# Patient Record
Sex: Male | Born: 1975 | Race: Black or African American | Hispanic: No | Marital: Single | Smoking: Never smoker
Health system: Southern US, Community
[De-identification: ages and names within clinical notes are randomized; demographics above are authoritative.]

## PROBLEM LIST (undated history)

## (undated) DIAGNOSIS — J189 Pneumonia, unspecified organism: Secondary | ICD-10-CM

## (undated) DIAGNOSIS — I10 Essential (primary) hypertension: Secondary | ICD-10-CM

## (undated) DIAGNOSIS — E7800 Pure hypercholesterolemia, unspecified: Secondary | ICD-10-CM

## (undated) HISTORY — PX: ABDOMINAL SURGERY: SHX537

---

## 2013-10-26 DIAGNOSIS — G5620 Lesion of ulnar nerve, unspecified upper limb: Secondary | ICD-10-CM | POA: Insufficient documentation

## 2019-07-04 ENCOUNTER — Other Ambulatory Visit: Payer: Self-pay

## 2019-07-04 ENCOUNTER — Emergency Department (HOSPITAL_COMMUNITY): Payer: Medicaid Other

## 2019-07-04 ENCOUNTER — Encounter (HOSPITAL_COMMUNITY): Payer: Self-pay

## 2019-07-04 ENCOUNTER — Emergency Department (HOSPITAL_COMMUNITY)
Admission: EM | Admit: 2019-07-04 | Discharge: 2019-07-04 | Disposition: A | Discharge status: Home / Self Care | Payer: Medicaid Other | Attending: Emergency Medicine | Admitting: Emergency Medicine

## 2019-07-04 DIAGNOSIS — J189 Pneumonia, unspecified organism: Secondary | ICD-10-CM | POA: Insufficient documentation

## 2019-07-04 DIAGNOSIS — R0602 Shortness of breath: Secondary | ICD-10-CM | POA: Diagnosis present

## 2019-07-04 DIAGNOSIS — R05 Cough: Secondary | ICD-10-CM | POA: Diagnosis not present

## 2019-07-04 DIAGNOSIS — R0789 Other chest pain: Secondary | ICD-10-CM | POA: Diagnosis not present

## 2019-07-04 DIAGNOSIS — R531 Weakness: Secondary | ICD-10-CM | POA: Diagnosis not present

## 2019-07-04 LAB — BASIC METABOLIC PANEL
Anion gap: 13 (ref 5–15)
BUN: 10 mg/dL (ref 6–20)
CO2: 23 mmol/L (ref 22–32)
Calcium: 9 mg/dL (ref 8.9–10.3)
Chloride: 102 mmol/L (ref 98–111)
Creatinine, Ser: 0.83 mg/dL (ref 0.61–1.24)
GFR calc Af Amer: >60 mL/min (ref >60)
GFR calc non Af Amer: >60 mL/min (ref >60)
Glucose, Bld: 117 mg/dL — ABNORMAL HIGH (ref 70–99)
Potassium: 3.3 mmol/L — ABNORMAL LOW (ref 3.5–5.1)
Sodium: 138 mmol/L (ref 135–145)

## 2019-07-04 LAB — CBC
HCT: 38.3 % — ABNORMAL LOW (ref 39.0–52.0)
Hemoglobin: 13.1 g/dL (ref 13.0–17.0)
MCH: 30.8 pg (ref 26.0–34.0)
MCHC: 34.2 g/dL (ref 30.0–36.0)
MCV: 90.1 fL (ref 80.0–100.0)
Platelets: 219 10*3/uL (ref 150–400)
RBC: 4.25 MIL/uL (ref 4.22–5.81)
RDW: 13 % (ref 11.5–15.5)
WBC: 3.4 10*3/uL — ABNORMAL LOW (ref 4.0–10.5)
nRBC: 0 % (ref 0.0–0.2)

## 2019-07-04 MED ORDER — KETOROLAC TROMETHAMINE 15 MG/ML IJ SOLN
15.0000 mg | Freq: Once | INTRAMUSCULAR | Status: AC
  Administered 2019-07-04: 15 mg via INTRAVENOUS
  Filled 2019-07-04: qty 1

## 2019-07-04 MED ORDER — AZITHROMYCIN 250 MG PO TABS: 250.0000 mg | ORAL_TABLET | Freq: Every day | ORAL | 0 refills | Status: AC

## 2019-07-04 MED ORDER — ACETAMINOPHEN 325 MG PO TABS
650.0000 mg | ORAL_TABLET | Freq: Once | ORAL | Status: AC
  Administered 2019-07-04: 650 mg via ORAL
  Filled 2019-07-04: qty 2

## 2019-07-04 MED ORDER — SODIUM CHLORIDE 0.9 % IV BOLUS
1000.0000 mL | Freq: Once | INTRAVENOUS | Status: AC
  Administered 2019-07-04: 19:00:00 1000 mL via INTRAVENOUS

## 2019-07-04 MED ORDER — SODIUM CHLORIDE 0.9 % IV SOLN
1.0000 g | Freq: Once | INTRAVENOUS | Status: AC
  Administered 2019-07-04: 19:00:00 1 g via INTRAVENOUS
  Filled 2019-07-04: qty 10

## 2019-07-04 MED ORDER — CEPHALEXIN 500 MG PO CAPS: 500.0000 mg | ORAL_CAPSULE | Freq: Two times a day (BID) | ORAL | 0 refills | Status: AC

## 2019-07-04 MED ORDER — SODIUM CHLORIDE 0.9 % IV SOLN
500.0000 mg | Freq: Once | INTRAVENOUS | Status: AC
  Administered 2019-07-04: 500 mg via INTRAVENOUS
  Filled 2019-07-04: qty 500

## 2019-07-04 NOTE — ED Notes (Signed)
Discharge instructions reviewed with pt. Pt verbalized understanding.   

## 2019-07-04 NOTE — ED Provider Notes (Signed)
[facility] EMERGENCY DEPARTMENT Provider Note   CSN: [id number] Arrival date & time: 07/04/19  1446     History Chief Complaint  Patient presents with  . Shortness of Breath  . Cough    Luke Goodwin is a 44 y.o. male.  HPI    Patient presents with concern of chest pain, fatigue, discomfort, nausea, anorexia, vomiting. Onset was about 1 week ago, and since that time he has had persistent symptoms.  He has been intolerant of oral medication, thus, no clear relieving factors. Patient went to a different emergency department yesterday, reportedly had x-ray, was notified of pneumonia, but left prior to receiving therapy, or labs. Today he presents due to persistent discomfort. He states that he is generally well, denies chronic medical problems, does not smoke, does not drink.  History reviewed. No pertinent past medical history.  There are no problems to display for this patient.   History reviewed. No pertinent surgical history.     No family history on file.  Social History   Tobacco Use  . Smoking status: Not on file  Substance Use Topics  . Alcohol use: Not on file  . Drug use: Not on file    Home Medications Prior to Admission medications   Not on File    Allergies    Patient has no allergy information on record.  Review of Systems   Review of Systems  Constitutional:       Per HPI, otherwise negative  HENT:       Per HPI, otherwise negative  Respiratory:       Per HPI, otherwise negative  Cardiovascular:       Per HPI, otherwise negative  Gastrointestinal: Positive for nausea and vomiting. Negative for abdominal pain.  Endocrine:       Negative aside from HPI  Genitourinary:       Neg aside from HPI   Musculoskeletal:       Per HPI, otherwise negative  Skin: Negative.   Allergic/Immunologic: Negative for immunocompromised state.  Neurological: Positive for weakness. Negative for syncope.    Physical Exam Updated Vital  Signs BP 133/88   Pulse 65   Temp 98.6 F (37 C) (Oral)   Resp 20   Ht 5\' 11"  (1.803 m)   Wt 80.7 kg   SpO2 95%   BMI 24.83 kg/m   Physical Exam Vitals and nursing note reviewed.  Constitutional:      General: He is not in acute distress.    Appearance: He is well-developed. He is ill-appearing. He is not toxic-appearing or diaphoretic.  HENT:     Head: Normocephalic and atraumatic.  Eyes:     Conjunctiva/sclera: Conjunctivae normal.  Cardiovascular:     Rate and Rhythm: Normal rate and regular rhythm.  Pulmonary:     Effort: Tachypnea present.  Abdominal:     General: There is no distension.  Skin:    General: Skin is warm and dry.  Neurological:     Mental Status: He is alert and oriented to person, place, and time.     ED Results / Procedures / Treatments   Labs (all labs ordered are listed, but only abnormal results are displayed) Labs Reviewed  BASIC METABOLIC PANEL - Abnormal; Notable for the following components:      Result Value   Potassium 3.3 (*)    Glucose, Bld 117 (*)    All other components within normal limits  CBC - Abnormal; Notable for the  following components:   WBC 3.4 (*)    HCT 38.3 (*)    All other components within normal limits    EKG EKG Interpretation  Date/Time:  Wednesday July 04 2019 14:53:24 EDT Ventricular Rate:  77 PR Interval:  120 QRS Duration: 78 QT Interval:  366 QTC Calculation: 414 R Axis:   0 Text Interpretation: Normal sinus rhythm Normal ECG Confirmed by Carmin Muskrat [phone]) on 07/04/2019 5:57:44 PM   Radiology DG Chest 2 View  Result Date: 07/04/2019 CLINICAL DATA:  Short of breath for 1 week, pneumonia diagnosed yesterday EXAM: CHEST - 2 VIEW COMPARISON:  07/03/2019 FINDINGS: Frontal and lateral views of the chest again demonstrates dense right middle lobe consolidation compatible with pneumonia. No effusion or pneumothorax. No acute bony abnormality. IMPRESSION: 1. Right middle lobe pneumonia.  Electronically Signed   By: Randa Ngo M.D.   On: 07/04/2019 15:55    Procedures Procedures (including critical care time)  Medications Ordered in ED Medications  acetaminophen (TYLENOL) tablet 650 mg (650 mg Oral Given 07/04/19 1815)  sodium chloride 0.9 % bolus 1,000 mL (0 mLs Intravenous Stopped 07/04/19 2025)  cefTRIAXone (ROCEPHIN) 1 g in sodium chloride 0.9 % 100 mL IVPB (0 g Intravenous Stopped 07/04/19 2003)  azithromycin (ZITHROMAX) 500 mg in sodium chloride 0.9 % 250 mL IVPB (0 mg Intravenous Stopped 07/04/19 2025)  ketorolac (TORADOL) 15 MG/ML injection 15 mg (15 mg Intravenous Given 07/04/19 1904)    ED Course  I have reviewed the triage vital signs and the nursing notes.  Pertinent labs & imaging results that were available during my care of the patient were reviewed by me and considered in my medical decision making (see chart for details).    MDM Rules/Calculators/A&P                      10:18 PM Patient resting, in no distress, awakens easily.  He has no oxygen requirement, no increased work of breathing, states that he feels somewhat better. Labs reviewed, patient has trivial leukopenia, otherwise generally reassuring findings.  X-ray discussed again, concerning for right-sided pneumonia, but absent respiratory distress, new oxygen requirement, evidence for bacteremia, sepsis, no indication for admission. Final Clinical Impression(s) / ED Diagnoses Final diagnoses:  Community acquired pneumonia of right middle lobe of lung    Rx / DC Orders ED Discharge Orders         Ordered    cephALEXin (KEFLEX) 500 MG capsule  2 times daily     07/04/19 2219    azithromycin (ZITHROMAX) 250 MG tablet  Daily     07/04/19 2219           Carmin Muskrat, MD 07/04/19 2247

## 2019-07-04 NOTE — ED Triage Notes (Signed)
Pt reports one week of sob, not being able to eat and cough. Pt seen [facility] last night and was told he had pneumonia. Pt sent home with rx but did not get them filled yet because he called his PCP and they told him to come here. Pt a.o, resp e.u.

## 2019-07-04 NOTE — Discharge Instructions (Signed)
As discussed, your evaluation today has been largely reassuring.  But, it is important that you monitor your condition carefully, and do not hesitate to return to the ED if you develop new, or concerning changes in your condition. ? ?Otherwise, please follow-up with your physician for appropriate ongoing care. ? ?

## 2019-07-07 ENCOUNTER — Emergency Department (HOSPITAL_COMMUNITY): Payer: Medicaid Other

## 2019-07-07 ENCOUNTER — Encounter (HOSPITAL_COMMUNITY): Payer: Self-pay | Admitting: Emergency Medicine

## 2019-07-07 ENCOUNTER — Other Ambulatory Visit: Payer: Self-pay

## 2019-07-07 ENCOUNTER — Emergency Department (HOSPITAL_COMMUNITY)
Admission: EM | Admit: 2019-07-07 | Discharge: 2019-07-07 | Disposition: A | Discharge status: Home / Self Care | Payer: Medicaid Other | Attending: Emergency Medicine | Admitting: Emergency Medicine

## 2019-07-07 DIAGNOSIS — R0602 Shortness of breath: Secondary | ICD-10-CM | POA: Diagnosis present

## 2019-07-07 DIAGNOSIS — Z86718 Personal history of other venous thrombosis and embolism: Secondary | ICD-10-CM | POA: Diagnosis not present

## 2019-07-07 DIAGNOSIS — R110 Nausea: Secondary | ICD-10-CM | POA: Insufficient documentation

## 2019-07-07 DIAGNOSIS — J1282 Pneumonia due to coronavirus disease 2019: Secondary | ICD-10-CM | POA: Diagnosis not present

## 2019-07-07 DIAGNOSIS — R197 Diarrhea, unspecified: Secondary | ICD-10-CM | POA: Insufficient documentation

## 2019-07-07 DIAGNOSIS — J189 Pneumonia, unspecified organism: Secondary | ICD-10-CM

## 2019-07-07 DIAGNOSIS — R05 Cough: Secondary | ICD-10-CM | POA: Diagnosis not present

## 2019-07-07 DIAGNOSIS — U071 COVID-19: Secondary | ICD-10-CM | POA: Insufficient documentation

## 2019-07-07 DIAGNOSIS — R0981 Nasal congestion: Secondary | ICD-10-CM | POA: Diagnosis not present

## 2019-07-07 DIAGNOSIS — R509 Fever, unspecified: Secondary | ICD-10-CM | POA: Diagnosis not present

## 2019-07-07 HISTORY — DX: Pneumonia, unspecified organism: J18.9

## 2019-07-07 LAB — BASIC METABOLIC PANEL
Anion gap: 14 (ref 5–15)
BUN: 9 mg/dL (ref 6–20)
CO2: 19 mmol/L — ABNORMAL LOW (ref 22–32)
Calcium: 8.3 mg/dL — ABNORMAL LOW (ref 8.9–10.3)
Chloride: 100 mmol/L (ref 98–111)
Creatinine, Ser: 0.82 mg/dL (ref 0.61–1.24)
GFR calc Af Amer: >60 mL/min (ref >60)
GFR calc non Af Amer: >60 mL/min (ref >60)
Glucose, Bld: 154 mg/dL — ABNORMAL HIGH (ref 70–99)
Potassium: 3 mmol/L — ABNORMAL LOW (ref 3.5–5.1)
Sodium: 133 mmol/L — ABNORMAL LOW (ref 135–145)

## 2019-07-07 LAB — CBC
HCT: 34.7 % — ABNORMAL LOW (ref 39.0–52.0)
Hemoglobin: 12.1 g/dL — ABNORMAL LOW (ref 13.0–17.0)
MCH: 31.6 pg (ref 26.0–34.0)
MCHC: 34.9 g/dL (ref 30.0–36.0)
MCV: 90.6 fL (ref 80.0–100.0)
Platelets: 247 10*3/uL (ref 150–400)
RBC: 3.83 MIL/uL — ABNORMAL LOW (ref 4.22–5.81)
RDW: 13.1 % (ref 11.5–15.5)
WBC: 5.2 10*3/uL (ref 4.0–10.5)
nRBC: 0 % (ref 0.0–0.2)

## 2019-07-07 LAB — RESPIRATORY PANEL BY RT PCR (FLU A&B, COVID)
Influenza A by PCR: NEGATIVE
Influenza B by PCR: NEGATIVE
SARS Coronavirus 2 by RT PCR: POSITIVE — AB

## 2019-07-07 LAB — TROPONIN I (HIGH SENSITIVITY)
Troponin I (High Sensitivity): 15 ng/L (ref <18)
Troponin I (High Sensitivity): 17 ng/L (ref <18)

## 2019-07-07 LAB — POC SARS CORONAVIRUS 2 AG -  ED: SARS Coronavirus 2 Ag: NEGATIVE

## 2019-07-07 LAB — D-DIMER, QUANTITATIVE: D-Dimer, Quant: 2.41 ug/mL-FEU — ABNORMAL HIGH (ref 0.00–0.50)

## 2019-07-07 MED ORDER — POTASSIUM CHLORIDE CRYS ER 20 MEQ PO TBCR
40.0000 meq | EXTENDED_RELEASE_TABLET | Freq: Once | ORAL | Status: AC
  Administered 2019-07-07: 15:00:00 40 meq via ORAL
  Filled 2019-07-07: qty 2

## 2019-07-07 MED ORDER — SODIUM CHLORIDE 0.9 % IV BOLUS
500.0000 mL | Freq: Once | INTRAVENOUS | Status: AC
  Administered 2019-07-07: 13:00:00 500 mL via INTRAVENOUS

## 2019-07-07 MED ORDER — POTASSIUM CHLORIDE CRYS ER 20 MEQ PO TBCR: 20.0000 meq | EXTENDED_RELEASE_TABLET | Freq: Every day | ORAL | 0 refills | Status: AC

## 2019-07-07 MED ORDER — FLUTICASONE PROPIONATE 50 MCG/ACT NA SUSP: 1.0000 | Freq: Every day | NASAL | 0 refills | Status: AC

## 2019-07-07 MED ORDER — SODIUM CHLORIDE 0.9% FLUSH: 3.0000 mL | Freq: Once | INTRAVENOUS | Status: DC

## 2019-07-07 MED ORDER — BENZONATATE 100 MG PO CAPS: 100.0000 mg | ORAL_CAPSULE | Freq: Three times a day (TID) | ORAL | 0 refills | Status: AC

## 2019-07-07 MED ORDER — AEROCHAMBER PLUS FLO-VU LARGE MISC
Status: AC
  Filled 2019-07-07: qty 1

## 2019-07-07 MED ORDER — AEROCHAMBER PLUS FLO-VU LARGE MISC: 1.0000 | Freq: Once | Status: DC

## 2019-07-07 MED ORDER — ONDANSETRON 4 MG PO TBDP
4.0000 mg | ORAL_TABLET | Freq: Three times a day (TID) | ORAL | 0 refills | Status: AC | PRN
Start: 2019-07-07 — End: ?

## 2019-07-07 MED ORDER — ACETAMINOPHEN 325 MG PO TABS
650.0000 mg | ORAL_TABLET | Freq: Once | ORAL | Status: AC
  Administered 2019-07-07: 15:00:00 650 mg via ORAL
  Filled 2019-07-07: qty 2

## 2019-07-07 MED ORDER — ALBUTEROL SULFATE HFA 108 (90 BASE) MCG/ACT IN AERS
2.0000 | INHALATION_SPRAY | Freq: Once | RESPIRATORY_TRACT | Status: AC
  Administered 2019-07-07: 12:00:00 2 via RESPIRATORY_TRACT
  Filled 2019-07-07: qty 6.7

## 2019-07-07 MED ORDER — ONDANSETRON HCL 4 MG/2ML IJ SOLN
4.0000 mg | Freq: Once | INTRAMUSCULAR | Status: AC
  Administered 2019-07-07: 13:00:00 4 mg via INTRAVENOUS

## 2019-07-07 MED ORDER — FENTANYL CITRATE (PF) 100 MCG/2ML IJ SOLN
50.0000 ug | Freq: Once | INTRAMUSCULAR | Status: AC
  Administered 2019-07-07: 13:00:00 50 ug via INTRAVENOUS
  Filled 2019-07-07: qty 2

## 2019-07-07 MED ORDER — IOHEXOL 350 MG/ML SOLN
75.0000 mL | Freq: Once | INTRAVENOUS | Status: AC | PRN
  Administered 2019-07-07: 16:00:00 75 mL via INTRAVENOUS

## 2019-07-07 NOTE — ED Notes (Signed)
Patient given discharge instructions patient verbalizes understanding. 

## 2019-07-07 NOTE — ED Notes (Signed)
Wife, Marcelle Smiling and Mother Lanora Manis would like an update. [phone]/ [phone]

## 2019-07-07 NOTE — ED Notes (Signed)
POC CoV-2 COVID test " NEGATIVE" reported to Dr. Clarene Duke.

## 2019-07-07 NOTE — ED Triage Notes (Signed)
Pt states he was seen in ED on Wednesday and diagnosed with pneumonia.  Taking antibiotic without improvement.  C/o SOB, fever, chills, and body aches.

## 2019-07-07 NOTE — Discharge Instructions (Addendum)
You were seen in the emergency department and diagnosed with COVID-19 today. Your labs show that you are bit dehydrated therefore you are given fluids.  You had some mild electrolyte abnormalities as well which we replaced with fluids and tablets in the ER. You have a mild degree of anemia. Please have your labs rechecked by primary care provider within 1 week. Your CT scan did not show a blood clot.  We suspect your symptoms are primarily related to COVID 19.  We are sending you home with the following medicine stop your symptoms: -Flonase: 1 spray per nostril daily as needed for nasal congestion -Tessalon: Take 1 tablet every 8 hours as needed for coughing -Zofran: Take every hours as needed for nausea and vomiting -Potassium supplement: Take 1 tablet daily for the next 3 days to help with your low potassium in the ER. -Albuterol inhaler: Use 1 to 2 puffs every 4-6 hours as needed for trouble breathing.  Please take Motrin and/or Tylenol per over-the-counter dosing to help with pain. We have prescribed you new medication(s) today. Discuss the medications prescribed today with your pharmacist as they can have adverse effects and interactions with your other medicines including over the counter and prescribed medications. Seek medical evaluation if you start to experience new or abnormal symptoms after taking one of these medicines, seek care immediately if you start to experience difficulty breathing, feeling of your throat closing, facial swelling, or rash as these could be indications of a more serious allergic reaction  Persons with COVID-19 who have symptoms and were directed to care for themselves at home may discontinue home isolation under the  following conditions: - It has been at least 7 days have passed since symptoms first appeared. - AND at least 3 days (72 hours) have passed since recovery defined as resolution of fever without the use of fever-reducing medications and improvement in  respiratory symptoms (e.g., cough, shortness of breath)  Please follow the below quarantine instructions.   Please follow up with primary care within 3-5 days for re-evaluation- call prior to going to the office to make them aware of your symptoms as some offices are altering their method of seeing patients with COVID 19 symptoms. Return to the ER for new or worsening symptoms including but not limited to increased work of breathing, chest pain, passing out, or any other concerns.       Person Under Monitoring Name: Luke Goodwin  Location: [address]  [place] [address]   Infection Prevention Recommendations for Individuals Confirmed to have, or Being Evaluated for, 2019 Novel Coronavirus (COVID-19) Infection Who Receive Care at Home  Individuals who are confirmed to have, or are being evaluated for, COVID-19 should follow the prevention steps below until a healthcare provider or local or state health department says they can return to normal activities.  Stay home except to get medical care You should restrict activities outside your home, except for getting medical care. Do not go to work, school, or public areas, and do not use public transportation or taxis.  Call ahead before visiting your doctor Before your medical appointment, call the healthcare provider and tell them that you have, or are being evaluated for, COVID-19 infection. This will help the healthcare provider's office take steps to keep other people from getting infected. Ask your healthcare provider to call the local or state health department.  Monitor your symptoms Seek prompt medical attention if your illness is worsening (e.g., difficulty breathing). Before going to your medical  appointment, call the healthcare provider and tell them that you have, or are being evaluated for, COVID-19 infection. Ask your healthcare provider to call the local or state health department.  Wear a facemask You should wear  a facemask that covers your nose and mouth when you are in the same room with other people and when you visit a healthcare provider. People who live with or visit you should also wear a facemask while they are in the same room with you.  Separate yourself from other people in your home As much as possible, you should stay in a different room from other people in your home. Also, you should use a separate bathroom, if available.  Avoid sharing household items You should not share dishes, drinking glasses, cups, eating utensils, towels, bedding, or other items with other people in your home. After using these items, you should wash them thoroughly with soap and water.  Cover your coughs and sneezes Cover your mouth and nose with a tissue when you cough or sneeze, or you can cough or sneeze into your sleeve. Throw used tissues in a lined trash can, and immediately wash your hands with soap and water for at least 20 seconds or use an alcohol-based hand rub.  Wash your [employer] [employer] your hands often and thoroughly with soap and water for at least 20 seconds. You can use an alcohol-based hand sanitizer if soap and water are not available and if your hands are not visibly dirty. Avoid touching your eyes, nose, and mouth with unwashed hands.   Prevention Steps for Caregivers and Household Members of Individuals Confirmed to have, or Being Evaluated for, COVID-19 Infection Being Cared for in the Home  If you live with, or provide care at home for, a person confirmed to have, or being evaluated for, COVID-19 infection please follow these guidelines to prevent infection:  Follow healthcare provider's instructions Make sure that you understand and can help the patient follow any healthcare provider instructions for all care.  Provide for the patient's basic needs You should help the patient with basic needs in the home and provide support for getting groceries, prescriptions, and other personal  needs.  Monitor the patient's symptoms If they are getting sicker, call his or her medical provider and tell them that the patient has, or is being evaluated for, COVID-19 infection. This will help the healthcare provider's office take steps to keep other people from getting infected. Ask the healthcare provider to call the local or state health department.  Limit the number of people who have contact with the patient If possible, have only one caregiver for the patient. Other household members should stay in another home or place of residence. If this is not possible, they should stay in another room, or be separated from the patient as much as possible. Use a separate bathroom, if available. Restrict visitors who do not have an essential need to be in the home.  Keep older adults, very young children, and other sick people away from the patient Keep older adults, very young children, and those who have compromised immune systems or chronic health conditions away from the patient. This includes people with chronic heart, lung, or kidney conditions, diabetes, and cancer.  Ensure good ventilation Make sure that shared spaces in the home have good air flow, such as from an air conditioner or an opened window, weather permitting.  Wash your hands often Wash your hands often and thoroughly with soap and water for at least  20 seconds. You can use an alcohol based hand sanitizer if soap and water are not available and if your hands are not visibly dirty. Avoid touching your eyes, nose, and mouth with unwashed hands. Use disposable paper towels to dry your hands. If not available, use dedicated cloth towels and replace them when they become wet.  Wear a facemask and gloves Wear a disposable facemask at all times in the room and gloves when you touch or have contact with the patient's blood, body fluids, and/or secretions or excretions, such as sweat, saliva, sputum, nasal mucus, vomit, urine, or  feces.  Ensure the mask fits over your nose and mouth tightly, and do not touch it during use. Throw out disposable facemasks and gloves after using them. Do not reuse. Wash your hands immediately after removing your facemask and gloves. If your personal clothing becomes contaminated, carefully remove clothing and launder. Wash your hands after handling contaminated clothing. Place all used disposable facemasks, gloves, and other waste in a lined container before disposing them with other household waste. Remove gloves and wash your hands immediately after handling these items.  Do not share dishes, glasses, or other household items with the patient Avoid sharing household items. You should not share dishes, drinking glasses, cups, eating utensils, towels, bedding, or other items with a patient who is confirmed to have, or being evaluated for, COVID-19 infection. After the person uses these items, you should wash them thoroughly with soap and water.  Wash laundry thoroughly Immediately remove and wash clothes or bedding that have blood, body fluids, and/or secretions or excretions, such as sweat, saliva, sputum, nasal mucus, vomit, urine, or feces, on them. Wear gloves when handling laundry from the patient. Read and follow directions on labels of laundry or clothing items and detergent. In general, wash and dry with the warmest temperatures recommended on the label.  Clean all areas the individual has used often Clean all touchable surfaces, such as counters, tabletops, doorknobs, bathroom fixtures, toilets, phones, keyboards, tablets, and bedside tables, every day. Also, clean any surfaces that may have blood, body fluids, and/or secretions or excretions on them. Wear gloves when cleaning surfaces the patient has come in contact with. Use a diluted bleach solution (e.g., dilute bleach with 1 part bleach and 10 parts water) or a household disinfectant with a label that says EPA-registered for  coronaviruses. To make a bleach solution at home, add 1 tablespoon of bleach to 1 quart (4 cups) of water. For a larger supply, add  cup of bleach to 1 gallon (16 cups) of water. Read labels of cleaning products and follow recommendations provided on product labels. Labels contain instructions for safe and effective use of the cleaning product including precautions you should take when applying the product, such as wearing gloves or eye protection and making sure you have good ventilation during use of the product. Remove gloves and wash hands immediately after cleaning.  Monitor yourself for signs and symptoms of illness Caregivers and household members are considered close contacts, should monitor their health, and will be asked to limit movement outside of the home to the extent possible. Follow the monitoring steps for close contacts listed on the symptom monitoring form.   ? If you have additional questions, contact your local health department or call the epidemiologist on call at [phone] (available 24/7). ? This guidance is subject to change. For the most up-to-date guidance from [facility], please refer to their website: [email or web address]

## 2019-07-07 NOTE — ED Provider Notes (Signed)
44 year old with COVID sx for ~1 week. Pt has been on abx and not improving. COVID test here is positive. He does not qualify for monoclonal antibody infusion. Previous team is obtaining CTA of chest to r/o PE which is pending at shift change.  CTA is negative for PE but shows a multifocal pneumonia. Delta trop is normal. Discussed results with the pt. Sats are 99%. Supportive tx indicated. Will d/c.   Luke Born, PA-C 07/07/19 1752    Charlynne Pander, MD 07/08/19 [phone]

## 2019-07-07 NOTE — ED Notes (Signed)
Ambulated pt and he started out 97 on Room Air. Pt got dizzy upon standing and o2 went to 94% and pt sat back down. We attempted 1 more time and pt got dizzy again and he sat back down.
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
## 2019-12-27 DIAGNOSIS — M65322 Trigger finger, left index finger: Secondary | ICD-10-CM | POA: Insufficient documentation

## 2020-12-27 IMAGING — CR DG CHEST 2V
2 series · 2 of 2 positions shown · non-contrast
Comparison: 07/03/2019

CLINICAL DATA: Short of breath for 1 week, pneumonia diagnosed
yesterday

EXAM:
CHEST - 2 VIEW

[chest pa]
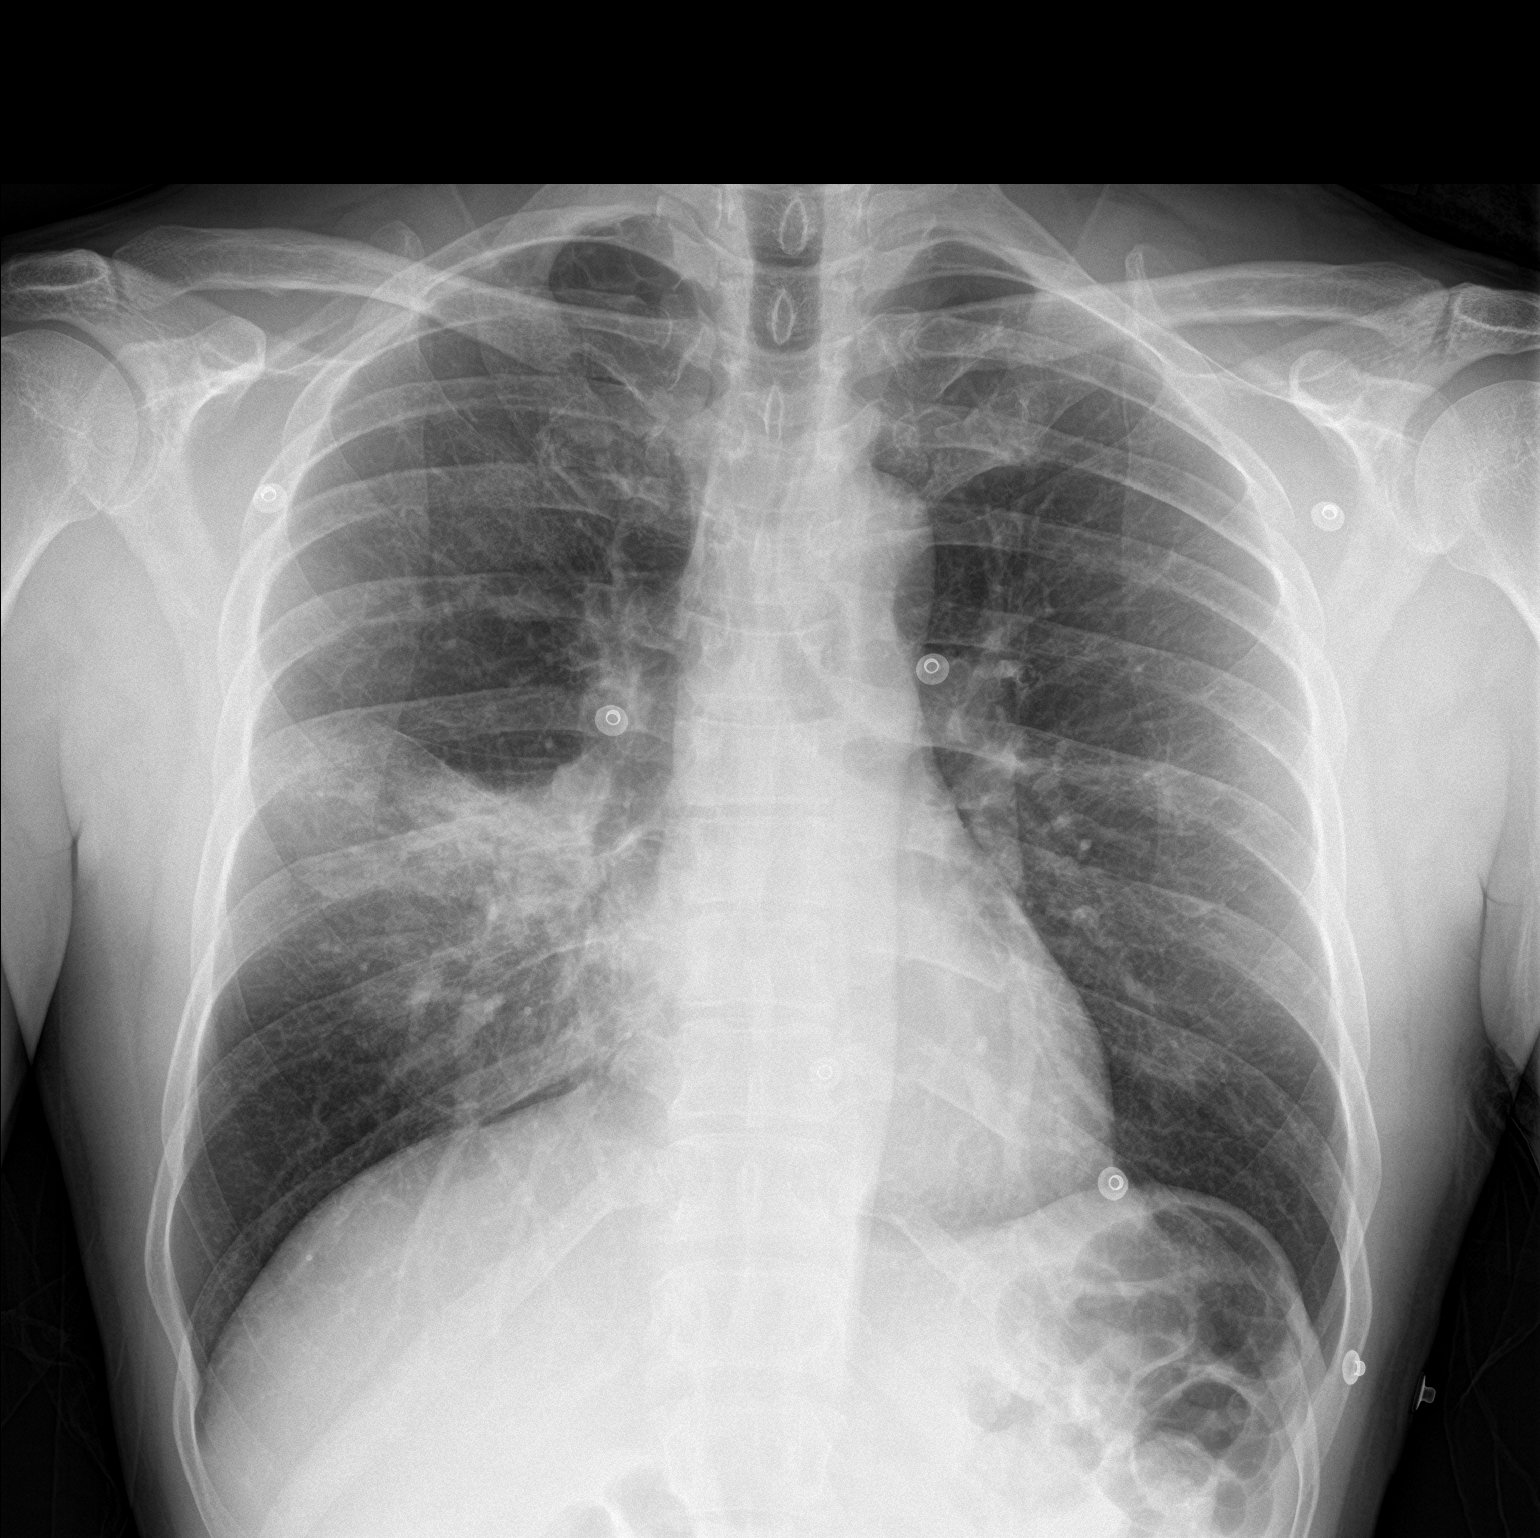

[chest lat]
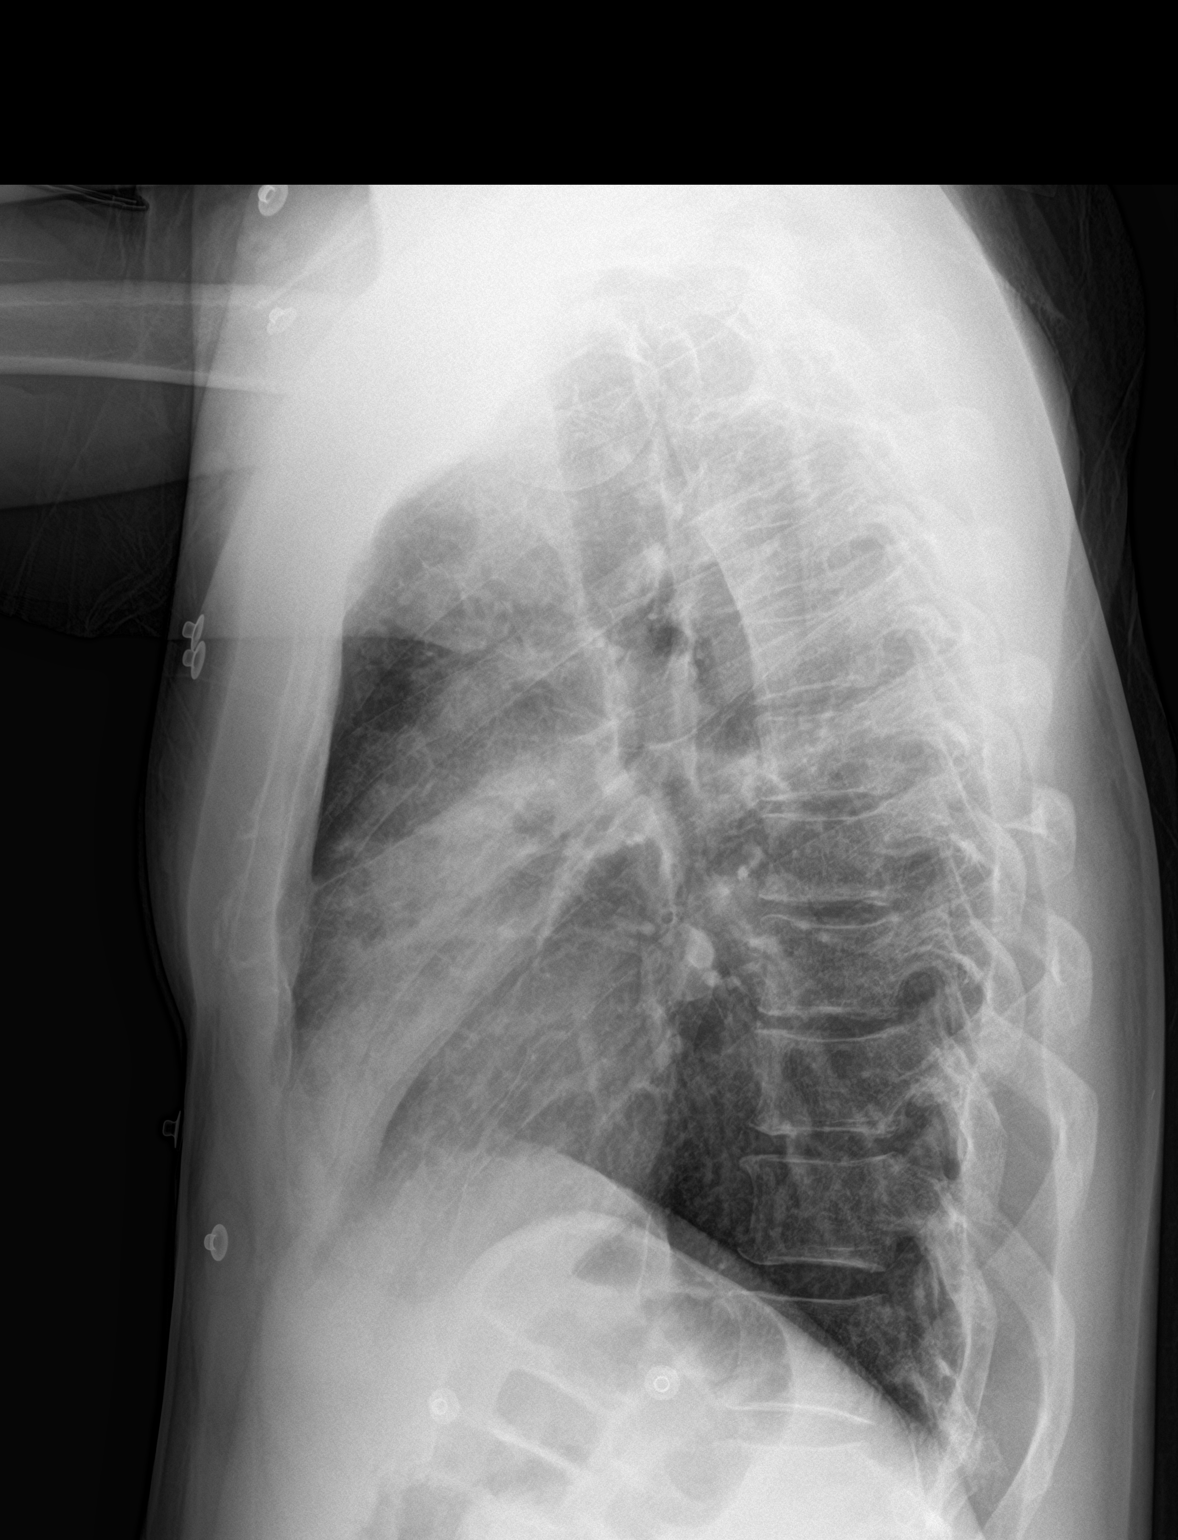

[2 of 2 positions shown; findings below may reference images not displayed]

FINDINGS: Frontal and lateral views of the chest again demonstrates dense
right middle lobe consolidation compatible with pneumonia. No
effusion or pneumothorax. No acute bony abnormality.
IMPRESSION: 1. Right middle lobe pneumonia.

## 2021-10-24 ENCOUNTER — Emergency Department (HOSPITAL_COMMUNITY)
Admission: EM | Admit: 2021-10-24 | Discharge: 2021-10-25 | Disposition: A | Discharge status: Home / Self Care | Payer: Medicaid Other | Attending: Emergency Medicine | Admitting: Emergency Medicine

## 2021-10-24 ENCOUNTER — Other Ambulatory Visit: Payer: Self-pay

## 2021-10-24 ENCOUNTER — Encounter (HOSPITAL_COMMUNITY): Payer: Self-pay | Admitting: *Deleted

## 2021-10-24 ENCOUNTER — Emergency Department (HOSPITAL_COMMUNITY): Payer: Medicaid Other

## 2021-10-24 DIAGNOSIS — R030 Elevated blood-pressure reading, without diagnosis of hypertension: Secondary | ICD-10-CM

## 2021-10-24 DIAGNOSIS — Z20822 Contact with and (suspected) exposure to covid-19: Secondary | ICD-10-CM | POA: Insufficient documentation

## 2021-10-24 DIAGNOSIS — R5383 Other fatigue: Secondary | ICD-10-CM

## 2021-10-24 DIAGNOSIS — I10 Essential (primary) hypertension: Secondary | ICD-10-CM | POA: Insufficient documentation

## 2021-10-24 DIAGNOSIS — R0602 Shortness of breath: Secondary | ICD-10-CM | POA: Diagnosis not present

## 2021-10-24 HISTORY — DX: Pure hypercholesterolemia, unspecified: E78.00

## 2021-10-24 HISTORY — DX: Essential (primary) hypertension: I10

## 2021-10-24 LAB — CBC
HCT: 37.2 % — ABNORMAL LOW (ref 39.0–52.0)
Hemoglobin: 12.9 g/dL — ABNORMAL LOW (ref 13.0–17.0)
MCH: 32 pg (ref 26.0–34.0)
MCHC: 34.7 g/dL (ref 30.0–36.0)
MCV: 92.3 fL (ref 80.0–100.0)
Platelets: 230 10*3/uL (ref 150–400)
RBC: 4.03 MIL/uL — ABNORMAL LOW (ref 4.22–5.81)
RDW: 13.4 % (ref 11.5–15.5)
WBC: 8.2 10*3/uL (ref 4.0–10.5)
nRBC: 0 % (ref 0.0–0.2)

## 2021-10-24 NOTE — ED Provider Triage Note (Signed)
  Emergency Medicine Provider Triage Evaluation Note  MRN:  [id number]  Arrival date & time: 10/24/21    Medically screening exam initiated at 11:16 PM.   CC:   SOB and fatigue  HPI:  Luke Goodwin is a 46 y.o. year-old male presents to the ED with chief complaint of night sweats and SOB.  Reports associate fatigue.  Denies measured fever.  Denies cough.  Reports hx of HTN and HL.  Has been following with his doctor.   History provided by patient. ROS:  -As included in HPI PE:   Vitals:   10/24/21 2307  BP: (!) 168/100  Pulse: 72  Resp: 16  Temp: 98.6 F (37 C)  SpO2: 98%    Non-toxic appearing No respiratory distress  MDM:  Based on signs and symptoms, COVID is highest on my differential, followed by ACS. I've ordered labs and imaging in triage to expedite lab/diagnostic workup.  Patient was informed that the remainder of the evaluation will be completed by another provider, this initial triage assessment does not replace that evaluation, and the importance of remaining in the ED until their evaluation is complete.    Roxy Horseman, PA-C 10/24/21 2319

## 2021-10-24 NOTE — ED Triage Notes (Signed)
Pt says he woke up sweating tonight, his bp was 170/115 tonight. Pt was started on BP medications on 09/28/21. He currently c/o shortness of breath, headache, and just not feeling well.

## 2021-10-25 LAB — BASIC METABOLIC PANEL
Anion gap: 11 (ref 5–15)
BUN: 9 mg/dL (ref 6–20)
CO2: 21 mmol/L — ABNORMAL LOW (ref 22–32)
Calcium: 9.6 mg/dL (ref 8.9–10.3)
Chloride: 107 mmol/L (ref 98–111)
Creatinine, Ser: 1.05 mg/dL (ref 0.61–1.24)
GFR, Estimated: >60 mL/min (ref >60)
Glucose, Bld: 101 mg/dL — ABNORMAL HIGH (ref 70–99)
Potassium: 3.3 mmol/L — ABNORMAL LOW (ref 3.5–5.1)
Sodium: 139 mmol/L (ref 135–145)

## 2021-10-25 LAB — TROPONIN I (HIGH SENSITIVITY)
Troponin I (High Sensitivity): 4 ng/L (ref <18)
Troponin I (High Sensitivity): 5 ng/L (ref <18)

## 2021-10-25 LAB — SARS CORONAVIRUS 2 BY RT PCR: SARS Coronavirus 2 by RT PCR: NEGATIVE

## 2021-10-25 MED ORDER — ALBUTEROL SULFATE HFA 108 (90 BASE) MCG/ACT IN AERS
2.0000 | INHALATION_SPRAY | Freq: Once | RESPIRATORY_TRACT | Status: AC
  Administered 2021-10-25: 2 via RESPIRATORY_TRACT
  Filled 2021-10-25: qty 6.7

## 2021-10-25 NOTE — ED Provider Notes (Signed)
[facility]-EMERGENCY DEPT [facility] Emergency Department Provider Note MRN:  [id number]  Arrival date & time: 10/25/21     Chief Complaint   Hypertension   History of Present Illness   Luke Goodwin is a 46 y.o. year-old male presents to the ED with chief complaint of night sweats, shortness of breath.  He states that he has been fatigued over the past couple of days.  Reports that he was recently diagnosed with high blood pressure high cholesterol by his doctor.  He has been taking his prescriptions as prescribed.  He states that he feels fatigued.  He denies any measured fever.     Review of Systems  Pertinent review of systems noted in HPI.    Physical Exam   Vitals:   10/24/21 2307 10/25/21 0047  BP: (!) 168/100 (!) 171/113  Pulse: 72 67  Resp: 16 18  Temp: 98.6 F (37 C) 98.1 F (36.7 C)  SpO2: 98% 98%    CONSTITUTIONAL:  well-appearing, NAD NEURO:  Alert and oriented x 3, CN 3-12 grossly intact EYES:  eyes equal and reactive ENT/NECK:  Supple, no stridor  CARDIO:  normal rate, appears well-perfused  PULM:  No respiratory distress, CTAB GI/GU:  non-distended,  MSK/SPINE:  No gross deformities, no edema, moves all extremities  SKIN:  no rash, atraumatic   *Additional and/or pertinent findings included in MDM below  Diagnostic and Interventional Summary    EKG Interpretation  Date/Time:  Saturday October 24 2021 23:08:40 EDT Ventricular Rate:  80 PR Interval:  134 QRS Duration: 84 QT Interval:  378 QTC Calculation: 435 R Axis:   -4 Text Interpretation: Normal sinus rhythm Normal ECG When compared with ECG of 07-Jul-2019 08:34, PREVIOUS ECG IS PRESENT similar to 4/21, V2 elevation unchanged Confirmed by Tanda Rockers ([id number]) on 10/24/2021 11:14:54 PM       Labs Reviewed  BASIC METABOLIC PANEL - Abnormal; Notable for the following components:      Result Value   Potassium 3.3 (*)    CO2 21 (*)    Glucose, Bld 101 (*)    All other components within  normal limits  CBC - Abnormal; Notable for the following components:   RBC 4.03 (*)    Hemoglobin 12.9 (*)    HCT 37.2 (*)    All other components within normal limits  SARS CORONAVIRUS 2 BY RT PCR  TROPONIN I (HIGH SENSITIVITY)  TROPONIN I (HIGH SENSITIVITY)    DG Chest 2 View  Final Result      Medications - No data to display   Procedures  /  Critical Care Procedures  ED Course and Medical Decision Making  I have reviewed the triage vital signs, the nursing notes, and pertinent available records from the EMR.  Social Determinants Affecting Complexity of Care: Patient has no clinically significant social determinants affecting this chief complaint..   ED Course:    Medical Decision Making Patient here with history of high blood pressure and high cholesterol.  States he has been feeling fatigued and rundown for the past couple of days.  Also reports associated night sweats and some shortness of breath.  He denies any measured fever.  He is not hypoxic.  He is nontoxic in appearance.  We will check labs and COVID reassess.  Given the patient's cardiac work-up is now reassuring, I suspect that he can be safely discharged home with PCP follow-up.     Amount and/or Complexity of Data Reviewed Labs: ordered.  Details: COVID test negative, troponin is 4, repeat is 5, no acute ischemic EKG changes.  Doubt ACS.  He is not hypoxic, nor tachycardic, doubt PE.  No evidence of endorgan damage. Radiology: ordered and independent interpretation performed.    Details: No obvious opacity or effusion  Risk Prescription drug management. Decision regarding hospitalization.     Consultants: No consultations were needed in caring for this patient.   Treatment and Plan: I considered admission due to patient's initial presentation, but after considering the examination and diagnostic results, patient will not require admission and can be discharged with outpatient  follow-up.    Final Clinical Impressions(s) / ED Diagnoses     ICD-10-CM   1. Elevated blood pressure reading  R03.0     2. Fatigue, unspecified type  R53.83       ED Discharge Orders     None         Discharge Instructions Discussed with and Provided to Patient:   Discharge Instructions   None      Roxy Horseman, PA-C 10/25/21 0430    Mesner, Barbara Cower, MD 10/25/21 [phone]

## 2021-10-25 NOTE — Discharge Instructions (Signed)
Your workup is reassuring.  Your blood work, EKG, and chest x-ray didn't show any sign of heart attack.  I recommend that you follow-up with your doctor.  Your doctor will need to continue to titrate your blood pressure medication.

## 2021-11-26 DIAGNOSIS — R011 Cardiac murmur, unspecified: Secondary | ICD-10-CM | POA: Diagnosis not present

## 2021-12-31 ENCOUNTER — Ambulatory Visit (AMBULATORY_SURGERY_CENTER): Payer: Self-pay

## 2021-12-31 VITALS — Ht 71.0 in | Wt 205.0 lb

## 2021-12-31 DIAGNOSIS — Z1211 Encounter for screening for malignant neoplasm of colon: Secondary | ICD-10-CM

## 2021-12-31 MED ORDER — PEG 3350-KCL-NA BICARB-NACL 420 G PO SOLR: 4000.0000 mL | Freq: Once | ORAL | 0 refills | Status: AC

## 2021-12-31 NOTE — Progress Notes (Signed)
No egg or soy allergy known to patient  No issues known to pt with past sedation with any surgeries or procedures Patient denies ever being told they had issues or difficulty with intubation  No FH of Malignant Hyperthermia Pt is not on diet pills Pt is not on  home 02  Pt is not on blood thinners  Pt denies issues with constipation  No A fib or A flutter Have any cardiac testing pending--denied Pt instructed to use [email or web address] or GoodRx for a price reduction on prep   PV over phone as pt forgot appt. Golytely instructions reviewed and mailed to verified address with sample consent.  Patient's chart reviewed by Luke Goodwin CNRA prior to previsit and patient appropriate for the [place].  Previsit completed and red dot placed by patient's name on their procedure day (on provider's schedule).

## 2022-01-18 ENCOUNTER — Telehealth: Payer: Self-pay | Admitting: Internal Medicine

## 2022-01-18 ENCOUNTER — Encounter: Payer: Medicaid Other | Admitting: Internal Medicine

## 2022-01-18 NOTE — Telephone Encounter (Signed)
Good Morning Dr. Hilarie Fredrickson,  I called this patient at 7:20 am this morning and spoke to patient he stated that he forgot and has to work.    He will call back to reschedule.  I will NO SHOW him  medicaid
